# Patient Record
Sex: Female | Born: 1940 | Hispanic: No | Smoking: Never smoker
Health system: Southern US, Community
[De-identification: ages and names within clinical notes are randomized; demographics above are authoritative.]

## PROBLEM LIST (undated history)

## (undated) DIAGNOSIS — F32A Depression, unspecified: Secondary | ICD-10-CM

## (undated) DIAGNOSIS — F0390 Unspecified dementia without behavioral disturbance: Secondary | ICD-10-CM

## (undated) DIAGNOSIS — F419 Anxiety disorder, unspecified: Secondary | ICD-10-CM

## (undated) DIAGNOSIS — I10 Essential (primary) hypertension: Secondary | ICD-10-CM

## (undated) DIAGNOSIS — G20A1 Parkinson's disease without dyskinesia, without mention of fluctuations: Secondary | ICD-10-CM

## (undated) HISTORY — PX: CARDIAC SURGERY: SHX584

---

## 2012-05-09 DIAGNOSIS — I10 Essential (primary) hypertension: Secondary | ICD-10-CM

## 2012-06-08 ENCOUNTER — Ambulatory Visit: Payer: Self-pay | Admitting: Ophthalmology

## 2012-06-23 ENCOUNTER — Ambulatory Visit: Payer: Self-pay | Admitting: Ophthalmology

## 2012-07-22 ENCOUNTER — Ambulatory Visit: Payer: Self-pay | Admitting: Ophthalmology

## 2012-07-28 ENCOUNTER — Ambulatory Visit: Payer: Self-pay | Admitting: Ophthalmology

## 2015-03-07 NOTE — Op Note (Signed)
PATIENT NAME:  Jacqueline Stein, Jacqueline Stein MR#:  [id number] DATE OF BIRTH:  [birth year]  DATE OF PROCEDURE:  07/28/2012  PREOPERATIVE DIAGNOSIS: Visually significant cataract of the left eye.   POSTOPERATIVE DIAGNOSIS: Visually significant cataract of the left eye.   OPERATIVE PROCEDURE: Cataract extraction by phacoemulsification with implant of intraocular lens to left eye.   SURGEON: Galen ManilaWilliam Rielly Brunn, MD.   ANESTHESIA:  1. Managed anesthesia care.  2. Topical tetracaine drops followed by 2% Xylocaine jelly applied in the preoperative holding area.   COMPLICATIONS: None.   TECHNIQUE:  Stop and chop.   DESCRIPTION OF PROCEDURE: The patient was examined and consented in the preoperative holding area where the aforementioned topical anesthesia was applied to the left eye and then brought back to the Operating Room where the left eye was prepped and draped in the usual sterile ophthalmic fashion and a lid speculum was placed. A paracentesis was created with the side port blade and the anterior chamber was filled with viscoelastic. A near clear corneal incision was performed with the steel keratome. A continuous curvilinear capsulorrhexis was performed with a cystotome followed by the capsulorrhexis forceps. Hydrodissection and hydrodelineation were carried out with BSS on a blunt cannula. The lens was removed in a stop and chop technique and the remaining cortical material was removed with the irrigation-aspiration handpiece. The capsular bag was inflated with viscoelastic and the  Technis ZCB00 21.5-diopter lens, serial number [id number] was placed in the capsular bag without complication. The remaining viscoelastic was removed from the eye with the irrigation-aspiration handpiece. The wounds were hydrated. The anterior chamber was flushed with Miostat and the eye was inflated to physiologic pressure. The wounds were found to be water tight. The eye was dressed with Vigamox. The patient was given protective glasses  to wear throughout the day and a shield with which to sleep tonight. The patient was also given drops with which to begin a drop regimen today and will follow-up with me in one day.   ____________________________ Jacqueline FieldWilliam L. Khalis Hittle, MD wlp:cms D: 07/28/2012 17:18:27 ET T: 07/28/2012 17:39:48 ET JOB#: [id number]  cc: Vrishank Moster L. Ardath Lepak, MD, <Dictator> Jacqueline FieldWILLIAM L Keiron Iodice MD ELECTRONICALLY SIGNED 07/30/2012 13:13

## 2015-03-07 NOTE — Op Note (Signed)
PATIENT NAME:  Jacqueline Stein, Jacqueline Stein MR#:  [id number] DATE OF BIRTH:  01-01-1941  DATE OF PROCEDURE:  06/23/2012  PREOPERATIVE DIAGNOSIS: Visually significant cataract of the right eye.   POSTOPERATIVE DIAGNOSIS: Visually significant cataract of the right eye.   OPERATIVE PROCEDURE: Cataract extraction by phacoemulsification with implant of intraocular lens to right eye.   SURGEON: Galen ManilaWilliam Willard Farquharson, MD.   ANESTHESIA:  1. Managed anesthesia care.  2. Topical tetracaine drops followed by 2% Xylocaine jelly applied in the preoperative holding area.   COMPLICATIONS: None.   TECHNIQUE:  Stop and chop.  DESCRIPTION OF PROCEDURE: The patient was examined and consented in the preoperative holding area where the aforementioned topical anesthesia was applied to the right eye and then brought back to the Operating Room where the right eye was prepped and draped in the usual sterile ophthalmic fashion and a lid speculum was placed. A paracentesis was created with the side port blade and the anterior chamber was filled with viscoelastic. A near clear corneal incision was performed with the steel keratome. A continuous curvilinear capsulorrhexis was performed with a cystotome followed by the capsulorrhexis forceps. Hydrodissection and hydrodelineation were carried out with BSS on a blunt cannula. The lens was removed in a stop and chop technique and the remaining cortical material was removed with the irrigation-aspiration handpiece. The capsular bag was inflated with viscoelastic and the Technis ZCBOO  21.5-diopter lens, serial number [id number], was placed in the capsular bag without complication. The remaining viscoelastic was removed from the eye with the irrigation-aspiration handpiece. The wounds were hydrated. The anterior chamber was flushed with Miostat and the eye was inflated to physiologic pressure. The wounds were found to be water tight. The eye was dressed with Vigamox. The patient was given protective  glasses to wear throughout the day and a shield with which to sleep tonight. The patient was also given drops with which to begin a drop regimen today and will follow-up with me in one day.   ____________________________ Jacqueline FieldWilliam L. Haylin Camilli, MD wlp:cbb D: 06/23/2012 12:34:56 ET T: 06/23/2012 13:21:46 ET JOB#: [id number]  cc: Sakiya Stepka L. Jessa Stinson, MD, <Dictator> Jacqueline FieldWILLIAM L Swati Granberry MD ELECTRONICALLY SIGNED 06/24/2012 16:28

## 2015-06-26 ENCOUNTER — Other Ambulatory Visit: Payer: Self-pay | Admitting: Internal Medicine

## 2015-06-26 DIAGNOSIS — Z1231 Encounter for screening mammogram for malignant neoplasm of breast: Secondary | ICD-10-CM

## 2015-06-29 ENCOUNTER — Ambulatory Visit
Admission: RE | Admit: 2015-06-29 | Discharge: 2015-06-29 | Disposition: A | Discharge status: Home / Self Care | Payer: Medicare Other | Source: Ambulatory Visit | Attending: Internal Medicine | Admitting: Internal Medicine

## 2015-06-29 DIAGNOSIS — Z1231 Encounter for screening mammogram for malignant neoplasm of breast: Secondary | ICD-10-CM

## 2016-05-24 ENCOUNTER — Other Ambulatory Visit: Payer: Self-pay | Admitting: Family Medicine

## 2016-05-24 DIAGNOSIS — Z1231 Encounter for screening mammogram for malignant neoplasm of breast: Secondary | ICD-10-CM

## 2016-07-01 ENCOUNTER — Other Ambulatory Visit: Payer: Self-pay | Admitting: Family Medicine

## 2016-07-01 ENCOUNTER — Ambulatory Visit
Admission: RE | Admit: 2016-07-01 | Discharge: 2016-07-01 | Disposition: A | Discharge status: Home / Self Care | Payer: Medicare Other | Source: Ambulatory Visit | Attending: Family Medicine | Admitting: Family Medicine

## 2016-07-01 DIAGNOSIS — Z1231 Encounter for screening mammogram for malignant neoplasm of breast: Secondary | ICD-10-CM

## 2017-05-23 ENCOUNTER — Other Ambulatory Visit: Payer: Self-pay | Admitting: Family Medicine

## 2017-05-23 DIAGNOSIS — Z1231 Encounter for screening mammogram for malignant neoplasm of breast: Secondary | ICD-10-CM

## 2017-08-05 ENCOUNTER — Ambulatory Visit
Admission: RE | Admit: 2017-08-05 | Discharge: 2017-08-05 | Disposition: A | Discharge status: Home / Self Care | Payer: Medicare Other | Source: Ambulatory Visit | Attending: Family Medicine | Admitting: Family Medicine

## 2017-08-05 DIAGNOSIS — Z1231 Encounter for screening mammogram for malignant neoplasm of breast: Secondary | ICD-10-CM | POA: Insufficient documentation

## 2017-09-09 ENCOUNTER — Encounter: Payer: Self-pay | Admitting: Emergency Medicine

## 2017-09-09 ENCOUNTER — Ambulatory Visit
Admission: EM | Admit: 2017-09-09 | Discharge: 2017-09-09 | Disposition: A | Discharge status: Home / Self Care | Payer: Medicare Other | Attending: Family Medicine | Admitting: Family Medicine

## 2017-09-09 DIAGNOSIS — J988 Other specified respiratory disorders: Secondary | ICD-10-CM

## 2017-09-09 HISTORY — DX: Essential (primary) hypertension: I10

## 2017-09-09 MED ORDER — HYDROCOD POLST-CPM POLST ER 10-8 MG/5ML PO SUER: 5.0000 mL | Freq: Two times a day (BID) | ORAL | 0 refills | Status: AC | PRN

## 2017-09-09 NOTE — ED Provider Notes (Signed)
[facility]    CSN: [id number] Arrival date & time: 09/09/17  0830  History   Chief Complaint Chief Complaint  Patient presents with  . Cough  . Sore Throat   HPI  76 year old female presents with the above complaints.  Patient states that she has been sick since this past Wednesday.  Symptoms started with sore throat and then progressed to cough and congestion with associated body aches.  No documented fever.  No known exacerbating relieving factors.  No reports of shortness of breath.  No chills.  Cough is productive at times.  Cough is worse at night and interferes with sleep.  Patient states that her symptoms are severe.  She is feeling poorly.  No other associated symptoms.  No other complaints or concerns at this time.  PMH - CAD s/p CABG, HLD, HTN, Osteopenia, GERD  Surgical History - CABG, Angioplasty, Knee Arthroscopy, Colonoscopy.  Home Medications    Prior to Admission medications   Medication Sig Start Date End Date Taking? Authorizing Provider  aspirin 81 MG chewable tablet Chew 81 mg by mouth daily.   Yes [provider]  atenolol (TENORMIN) 50 MG tablet Take 50 mg by mouth daily.   Yes [provider]  clopidogrel (PLAVIX) 75 MG tablet Take 75 mg by mouth daily.   Yes [provider]  hydrochlorothiazide (MICROZIDE) 12.5 MG capsule Take 12.5 mg by mouth daily.   Yes [provider]  losartan (COZAAR) 100 MG tablet Take 100 mg by mouth daily.   Yes [provider]  Multiple Vitamin (MULTIVITAMIN) tablet Take 1 tablet by mouth daily.   Yes [provider]  chlorpheniramine-HYDROcodone (TUSSIONEX PENNKINETIC ER) 10-8 MG/5ML SUER Take 5 mLs by mouth every 12 (twelve) hours as needed. 09/09/17   Tommie Sams, DO    Family History Family History  Problem Relation Age of Onset  . Breast cancer Neg Hx    Social History Social History  Substance Use Topics  . Smoking status: Never Smoker  .  Smokeless tobacco: Never Used  . Alcohol use No   Allergies   Pepcid [famotidine] and Statins  Review of Systems Review of Systems  Constitutional: Negative for fever.  HENT: Positive for congestion and sore throat.   Respiratory: Positive for cough. Negative for shortness of breath.   All other systems reviewed and are negative.  Physical Exam Triage Vital Signs ED Triage Vitals  Enc Vitals Group     BP 09/09/17 0853 (!) 147/63     Pulse Rate 09/09/17 0853 74     Resp 09/09/17 0853 14     Temp 09/09/17 0853 98.3 F (36.8 C)     Temp Source 09/09/17 0853 Oral     SpO2 09/09/17 0853 100 %     Weight 09/09/17 0847 123 lb 12.8 oz (56.2 kg)     Height 09/09/17 0847 5\' 3"  (1.6 m)     Head Circumference --      Peak Flow --      Pain Score 09/09/17 0847 3     Pain Loc --      Pain Edu? --      Excl. in GC? --    Updated Vital Signs BP (!) 147/63 (BP Location: Left Arm)   Pulse 74   Temp 98.3 F (36.8 C) (Oral)   Resp 14   Ht 5\' 3"  (1.6 m)   Wt 123 lb 12.8 oz (56.2 kg)   SpO2 100%   BMI  21.93 kg/m   Physical Exam  Constitutional: She is oriented to person, place, and time. She appears well-developed. No distress.  HENT:  Head: Normocephalic and atraumatic.  Nose: Nose normal.  Mouth/Throat: Oropharynx is clear and moist. No oropharyngeal exudate.  TM's normal bilaterally.   Eyes: Conjunctivae are normal. Right eye exhibits no discharge. Left eye exhibits no discharge.  Neck: Normal range of motion. Neck supple.  Cardiovascular: Normal rate and regular rhythm.   No murmur heard. Pulmonary/Chest: Effort normal and breath sounds normal. No respiratory distress. She has no wheezes. She has no rales.  Abdominal: Soft. She exhibits no distension. There is no tenderness. There is no rebound and no guarding.  Lymphadenopathy:    She has no cervical adenopathy.  Neurological: She is alert and oriented to person, place, and time.  No focal deficits.  Skin: Skin is warm.  No rash noted.  Psychiatric: She has a normal mood and affect. Her behavior is normal.  Vitals reviewed.  UC Treatments / Results  Labs (all labs ordered are listed, but only abnormal results are displayed) Labs Reviewed - No data to display  EKG  EKG Interpretation None       Radiology No results found.  Procedures Procedures (including critical care time)  Medications Ordered in UC Medications - No data to display   Initial Impression / Assessment and Plan / UC Course  I have reviewed the triage vital signs and the nursing notes.  Pertinent labs & imaging results that were available during my care of the patient were reviewed by me and considered in my medical decision making (see chart for details).    76 year old female presents with a viral respiratory illness.  Advised supportive care and over-the-counter Tylenol as well as rest.  Tussionex for cough.  Final Clinical Impressions(s) / UC Diagnoses   Final diagnoses:  Respiratory infection   New Prescriptions Discharge Medication List as of 09/09/2017  9:06 AM    START taking these medications   Details  chlorpheniramine-HYDROcodone (TUSSIONEX PENNKINETIC ER) 10-8 MG/5ML SUER Take 5 mLs by mouth every 12 (twelve) hours as needed., Starting Tue 09/09/2017, Print       Controlled Substance Prescriptions [place] Controlled Substance Registry consulted? Not Applicable   Tommie SamsCook, Ringo Sherod G, DO 09/09/17 [id number]

## 2017-09-09 NOTE — ED Triage Notes (Signed)
Patient c/o cough, congestion, sore throat and runny nose that started last Wed.  Patient denies fevers.

## 2017-09-09 NOTE — Discharge Instructions (Signed)
This is viral.  Rest. Tylenol as needed.  Use the cough medication as directed.  Feel better.  Take care  Dr. Adriana Simasook

## 2018-06-29 ENCOUNTER — Other Ambulatory Visit: Payer: Self-pay | Admitting: Family Medicine

## 2018-06-29 DIAGNOSIS — Z1231 Encounter for screening mammogram for malignant neoplasm of breast: Secondary | ICD-10-CM

## 2018-08-10 ENCOUNTER — Ambulatory Visit
Admission: RE | Admit: 2018-08-10 | Discharge: 2018-08-10 | Disposition: A | Discharge status: Home / Self Care | Payer: Medicare HMO | Source: Ambulatory Visit | Attending: Family Medicine | Admitting: Family Medicine

## 2018-08-10 DIAGNOSIS — Z1231 Encounter for screening mammogram for malignant neoplasm of breast: Secondary | ICD-10-CM

## 2019-07-19 IMAGING — MG MM DIGITAL SCREENING BILAT W/ TOMO W/ CAD
8 of 12 series · 8 of 28 positions shown · non-contrast
Comparison: Previous exam(s).

CLINICAL DATA: Screening.

EXAM:
2D DIGITAL SCREENING BILATERAL MAMMOGRAM WITH CAD AND ADJUNCT TOMO

[R CC]
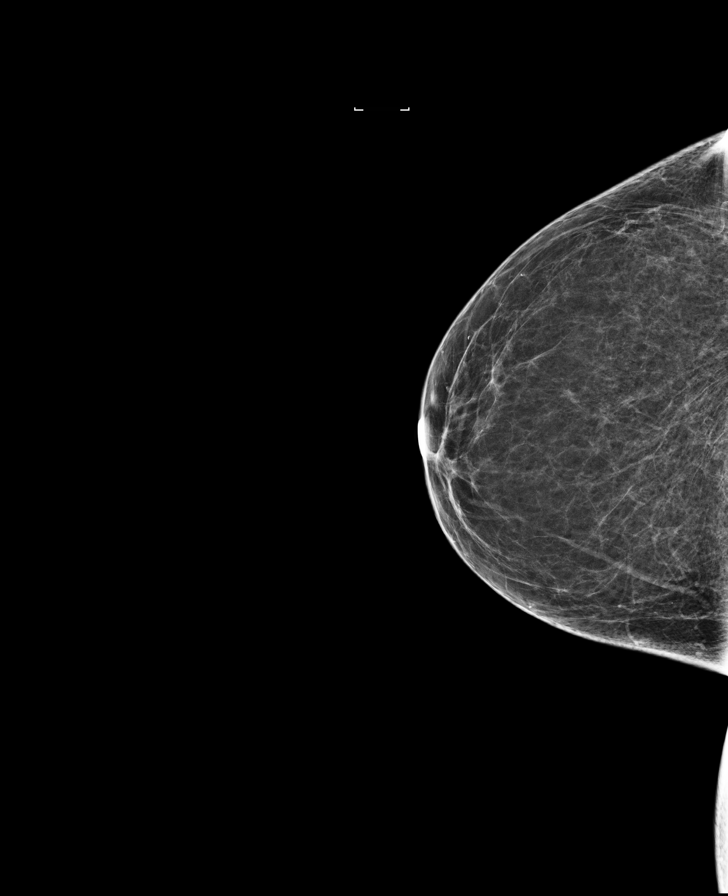

[L CC]
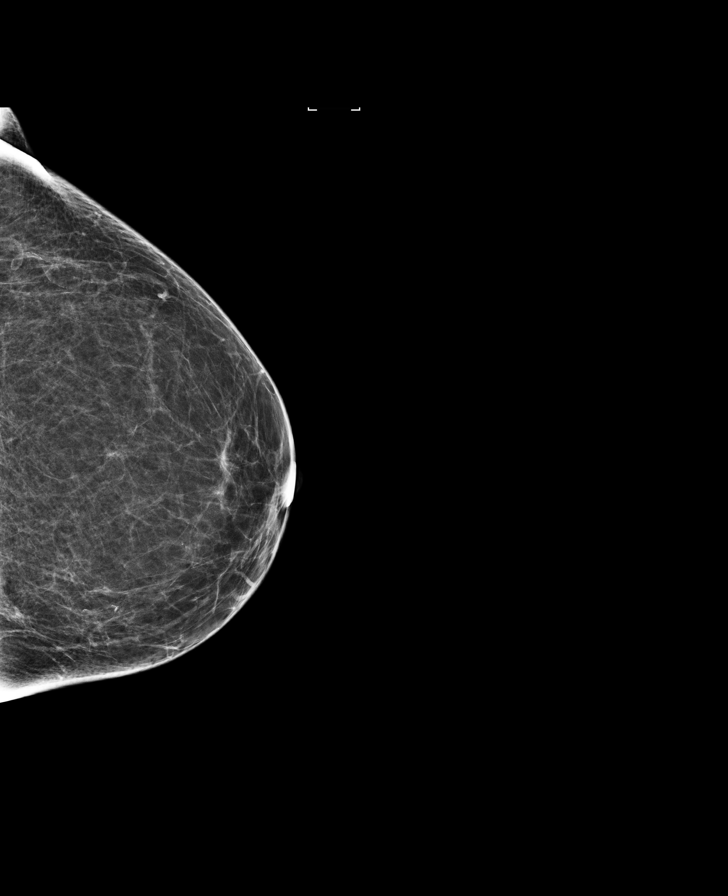

[L MLO]
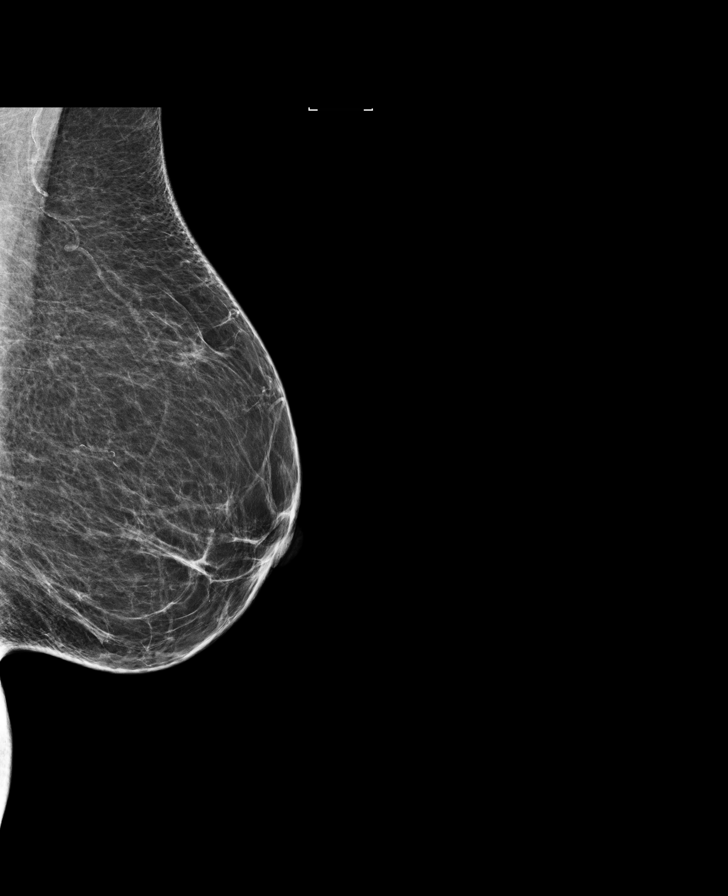

[L CC synth-2D]
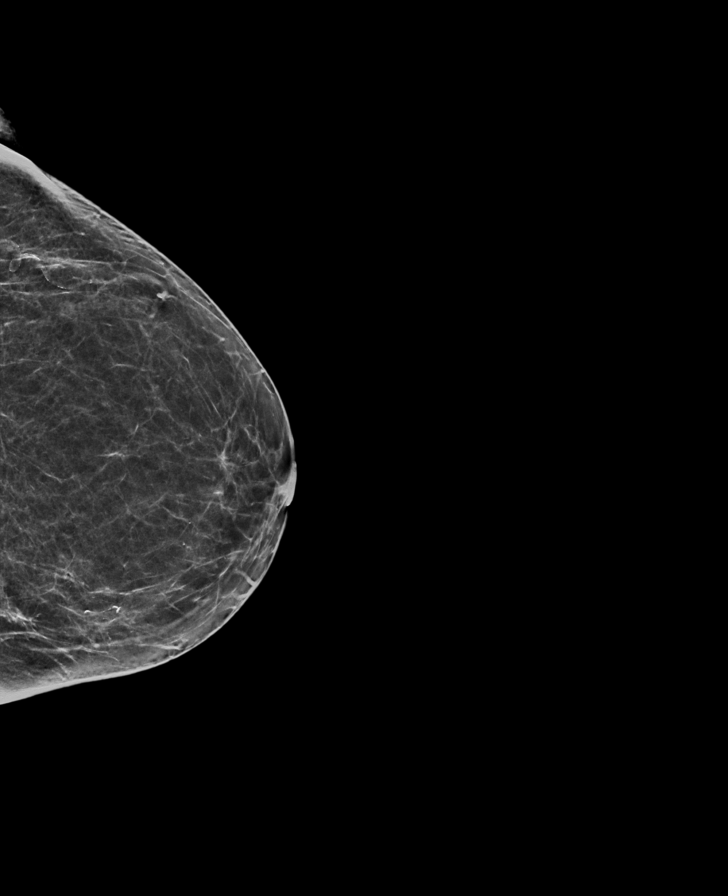

[L MLO synth-2D]
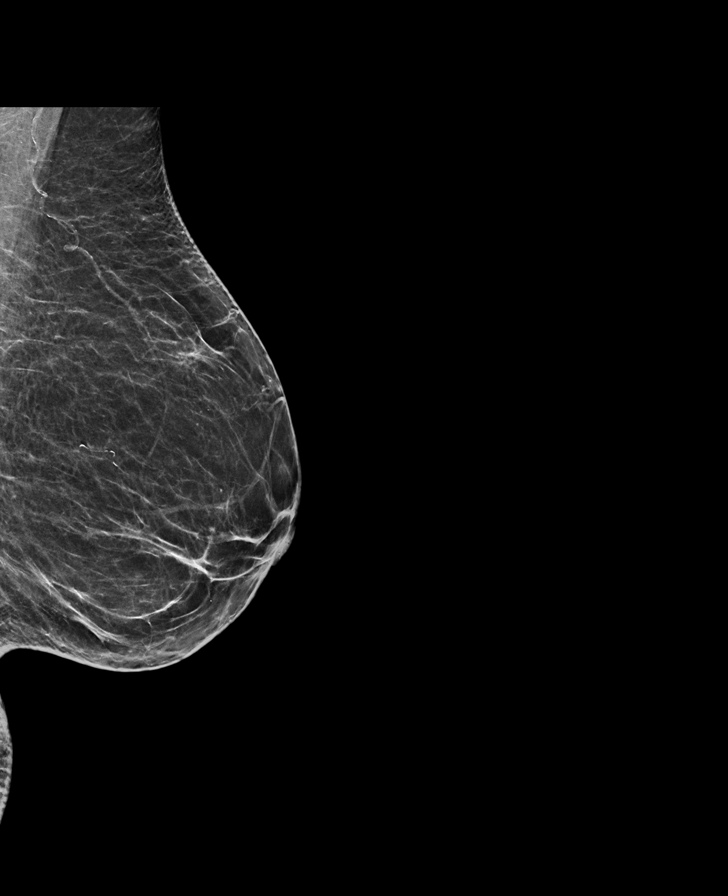

[R MLO synth-2D]
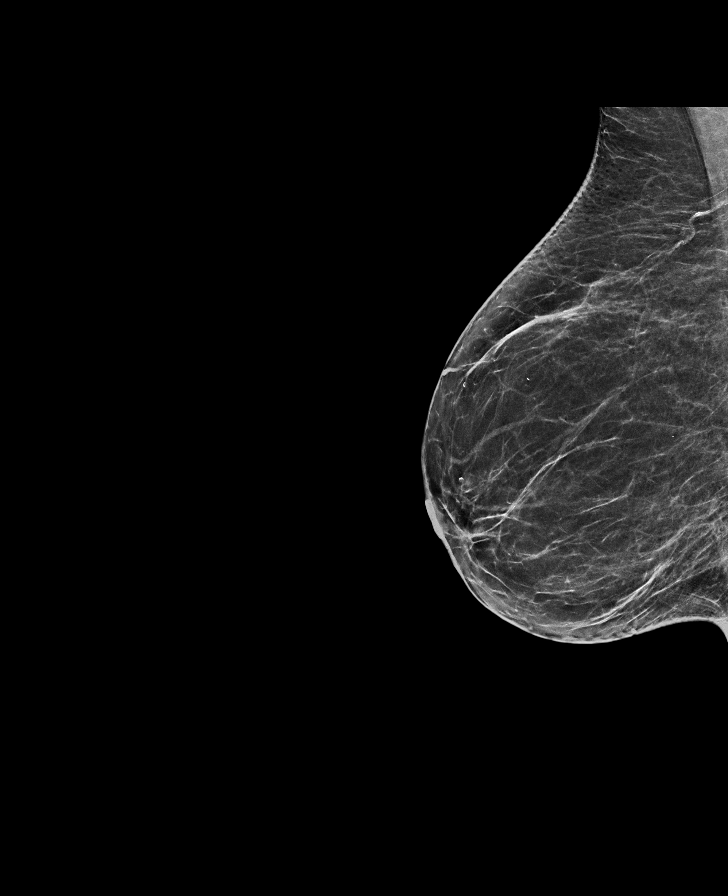

[R CC synth-2D]
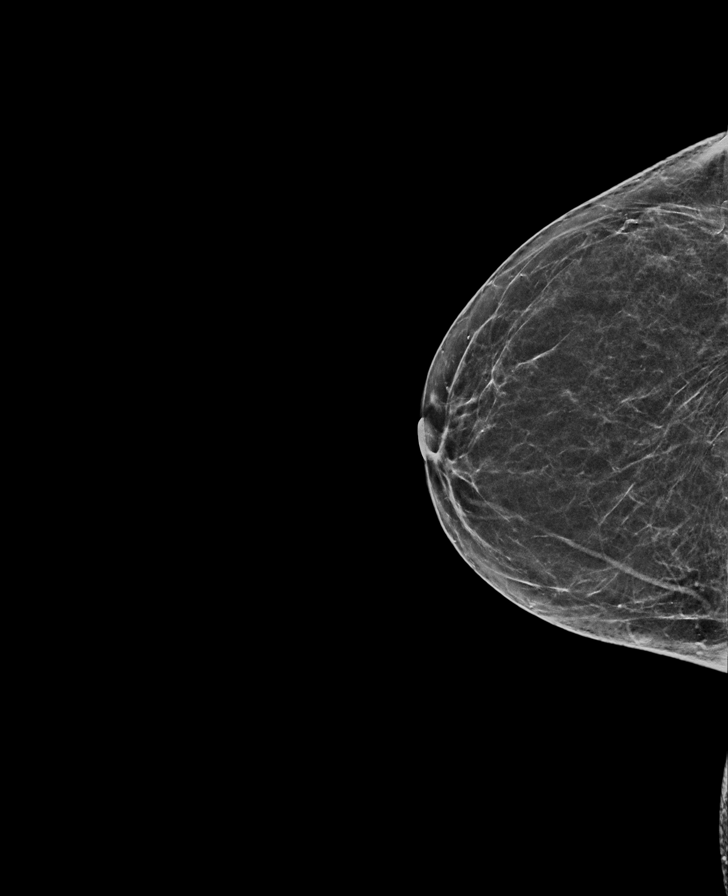

[R MLO]
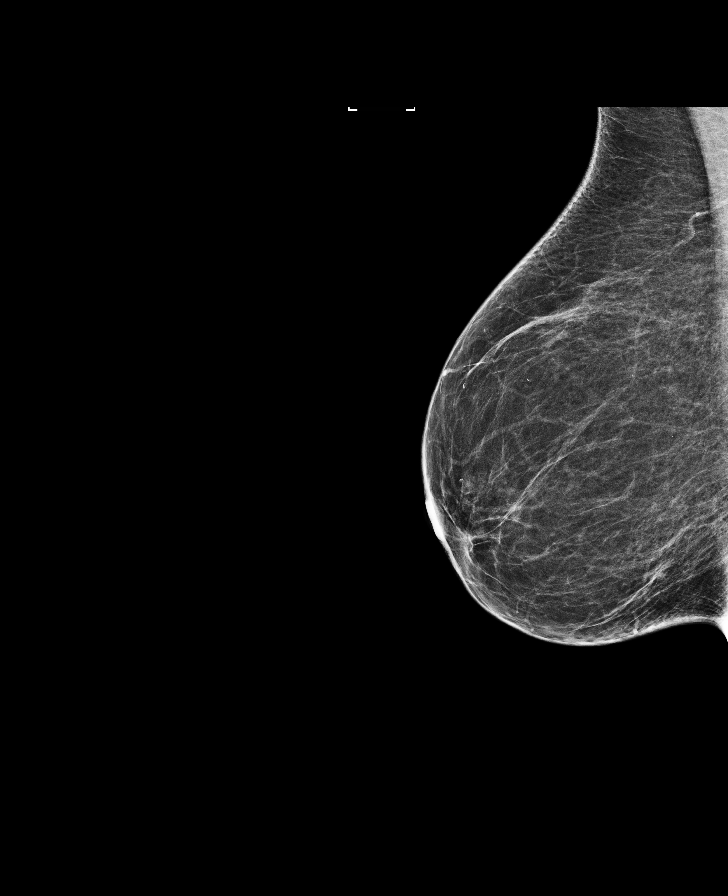

[8 of 28 positions shown; findings below may reference images not displayed]

ACR Breast Density Category b: There are scattered areas of
fibroglandular density.
FINDINGS: There are no findings suspicious for malignancy. Images were
processed with CAD.
IMPRESSION: No mammographic evidence of malignancy. A result letter of this
screening mammogram will be mailed directly to the patient.

RECOMMENDATION:
Screening mammogram in one year. (Code:97-6-RS4)

BI-RADS CATEGORY  1: Negative.

## 2019-08-13 ENCOUNTER — Other Ambulatory Visit: Payer: Self-pay | Admitting: Family Medicine

## 2019-08-13 DIAGNOSIS — Z1231 Encounter for screening mammogram for malignant neoplasm of breast: Secondary | ICD-10-CM

## 2019-09-08 ENCOUNTER — Ambulatory Visit
Admission: RE | Admit: 2019-09-08 | Discharge: 2019-09-08 | Disposition: A | Discharge status: Home / Self Care | Payer: Medicare HMO | Source: Ambulatory Visit | Attending: Family Medicine | Admitting: Family Medicine

## 2019-09-08 ENCOUNTER — Other Ambulatory Visit: Payer: Self-pay

## 2019-09-08 ENCOUNTER — Encounter (INDEPENDENT_AMBULATORY_CARE_PROVIDER_SITE_OTHER): Payer: Self-pay

## 2019-09-08 DIAGNOSIS — Z1231 Encounter for screening mammogram for malignant neoplasm of breast: Secondary | ICD-10-CM | POA: Diagnosis not present

## 2020-08-10 ENCOUNTER — Other Ambulatory Visit: Payer: Self-pay

## 2024-06-07 ENCOUNTER — Encounter: Payer: Self-pay | Admitting: Emergency Medicine

## 2024-06-07 ENCOUNTER — Emergency Department

## 2024-06-07 ENCOUNTER — Other Ambulatory Visit: Payer: Self-pay

## 2024-06-07 ENCOUNTER — Emergency Department
Admission: EM | Admit: 2024-06-07 | Discharge: 2024-06-08 | Disposition: A | Discharge status: Home / Self Care | Attending: Emergency Medicine | Admitting: Emergency Medicine

## 2024-06-07 DIAGNOSIS — G20A1 Parkinson's disease without dyskinesia, without mention of fluctuations: Secondary | ICD-10-CM | POA: Diagnosis not present

## 2024-06-07 DIAGNOSIS — W010XXA Fall on same level from slipping, tripping and stumbling without subsequent striking against object, initial encounter: Secondary | ICD-10-CM | POA: Diagnosis not present

## 2024-06-07 DIAGNOSIS — S52502A Unspecified fracture of the lower end of left radius, initial encounter for closed fracture: Secondary | ICD-10-CM | POA: Insufficient documentation

## 2024-06-07 DIAGNOSIS — W19XXXA Unspecified fall, initial encounter: Secondary | ICD-10-CM

## 2024-06-07 DIAGNOSIS — F0390 Unspecified dementia without behavioral disturbance: Secondary | ICD-10-CM | POA: Diagnosis not present

## 2024-06-07 DIAGNOSIS — S6992XA Unspecified injury of left wrist, hand and finger(s), initial encounter: Secondary | ICD-10-CM | POA: Diagnosis present

## 2024-06-07 DIAGNOSIS — S52501A Unspecified fracture of the lower end of right radius, initial encounter for closed fracture: Secondary | ICD-10-CM

## 2024-06-07 DIAGNOSIS — I10 Essential (primary) hypertension: Secondary | ICD-10-CM | POA: Insufficient documentation

## 2024-06-07 HISTORY — DX: Depression, unspecified: F32.A

## 2024-06-07 HISTORY — DX: Anxiety disorder, unspecified: F41.9

## 2024-06-07 HISTORY — DX: Unspecified dementia, unspecified severity, without behavioral disturbance, psychotic disturbance, mood disturbance, and anxiety: F03.90

## 2024-06-07 HISTORY — DX: Parkinson's disease without dyskinesia, without mention of fluctuations: G20.A1

## 2024-06-07 MED ORDER — LIDOCAINE HCL (PF) 1 % IJ SOLN
5.0000 mL | Freq: Once | INTRAMUSCULAR | Status: AC
  Administered 2024-06-07: 5 mL
  Filled 2024-06-07: qty 5

## 2024-06-07 MED ORDER — OXYCODONE HCL 5 MG PO TABS: 5.0000 mg | ORAL_TABLET | Freq: Once | ORAL | Status: DC

## 2024-06-07 MED ORDER — MORPHINE SULFATE (PF) 2 MG/ML IV SOLN
2.0000 mg | Freq: Once | INTRAVENOUS | Status: DC
  Filled 2024-06-07: qty 1

## 2024-06-07 MED ORDER — FENTANYL CITRATE PF 50 MCG/ML IJ SOSY
50.0000 ug | PREFILLED_SYRINGE | Freq: Once | INTRAMUSCULAR | Status: AC
  Administered 2024-06-07: 50 ug via INTRAMUSCULAR
  Filled 2024-06-07: qty 1

## 2024-06-07 NOTE — ED Provider Notes (Signed)
 [facility] Emergency Department Provider Note     Event Date/Time   First MD Initiated Contact with Patient 06/07/24 2210     (approximate)   History   Wrist Pain   HPI  Jacqueline Stein is a 83 y.o. female with a history of HTN, dementia and Parkinson disease presents to the ED following a fall onto her left wrist.  Patients daughter, who is main historian, reports the patient took a shower on her own today and slipped and fell. Denies head injury or LOC. Deformity noted with moderate swelling to left wrist.      Physical Exam   Triage Vital Signs: ED Triage Vitals  Encounter Vitals Group     BP 06/07/24 2144 136/62     Girls Systolic BP Percentile --      Girls Diastolic BP Percentile --      Boys Systolic BP Percentile --      Boys Diastolic BP Percentile --      Pulse Rate 06/07/24 2144 77     Resp 06/07/24 2144 18     Temp 06/07/24 2144 98.3 F (36.8 C)     Temp Source 06/07/24 2144 Oral     SpO2 06/07/24 2144 97 %     Weight 06/07/24 2143 140 lb (63.5 kg)     Height 06/07/24 2143 5' 3 (1.6 m)     Head Circumference --      Peak Flow --      Pain Score 06/07/24 2142 8     Pain Loc --      Pain Education --      Exclude from Growth Chart --     Most recent vital signs: Vitals:   06/07/24 2318 06/08/24 0047  BP: 134/68 (!) 154/68  Pulse: 70 71  Resp: 18 18  Temp:    SpO2: 97% 98%    General Awake, no distress.  CV:  Good peripheral perfusion.  RESP:  Normal effort.  ABD:  No distention.  Other:  Left wrist reveals obvious deformity. Radial pulse palpated. Limited FROM of all digits secondary to pain however her motor function remains intact. Brisk capillary refill.    ED Results / Procedures / Treatments   Labs (all labs ordered are listed, but only abnormal results are displayed) Labs Reviewed - No data to display  RADIOLOGY  I personally viewed and evaluated these images as part of my medical decision making,  as well as reviewing the written report by the radiologist.  ED Provider Interpretation: Left distal radius fracture   DG Wrist 2 Views Left Result Date: 06/08/2024 EXAM: 2 VIEW(S) XRAY OF THE LEFT WRIST 06/08/2024 12:25:56 AM COMPARISON: 06/07/2024 CLINICAL HISTORY: [id number] Injury [id number]. POST REDUCTION LEFT WRIST FINDINGS: BONES AND JOINTS: Mildly displaced, impacted distal radial fracture with apex volar angulation. Overlying cast obscures fine osseous detail. SOFT TISSUES: The soft tissues are unremarkable. IMPRESSION: 1. Mildly displaced, impacted distal radial fracture, as above. Electronically signed by: Pinkie Pebbles MD 06/08/2024 12:27 AM EDT RP Workstation: [id number]   DG Wrist Complete Left Result Date: 06/07/2024 CLINICAL DATA:  Status post fall. EXAM: LEFT WRIST - COMPLETE 3+ VIEW COMPARISON:  None Available. FINDINGS: An acute fracture deformity is seen involving the distal left radius. Mild dorsal angulation of the distal fracture site is seen. There is no evidence of dislocation. Degenerative changes are present along the first carpometacarpal joint. Mild soft tissue swelling is seen along the previously noted fracture site. IMPRESSION:  Acute fracture of the distal left radius. Electronically Signed   By: Suzen Dials M.D.   On: 06/07/2024 22:29    PROCEDURES:  Critical Care performed: No  .Reduction of fracture  Date/Time: 06/08/2024 2:38 AM  Performed by: Margrette Monte A, PA-C Authorized by: Margrette Monte LABOR, PA-C  Consent: Verbal consent obtained Patient identity confirmed: arm band Local anesthesia used: yes Anesthesia: hematoma block  Anesthesia: Local anesthesia used: yes Local Anesthetic: lidocaine  1% without epinephrine  Sedation: Patient sedated: no  Patient tolerance: patient tolerated the procedure well with no immediate complications    MEDICATIONS ORDERED IN ED: Medications  lidocaine  (PF) (XYLOCAINE ) 1 % injection 5 mL (5 mLs Infiltration  Given 06/07/24 2314)  fentaNYL  (SUBLIMAZE ) injection 50 mcg (50 mcg Intramuscular Given 06/07/24 2314)  oxyCODONE -acetaminophen  (PERCOCET/ROXICET) 5-325 MG per tablet 1 tablet (1 tablet Oral Given 06/08/24 0042)     IMPRESSION / MDM / ASSESSMENT AND PLAN / ED COURSE  I reviewed the triage vital signs and the nursing notes.                               83 y.o. female presents to the emergency department for evaluation and treatment of acute distal radius. See HPI for further details.   Differential diagnosis includes, but is not limited to fracture, dislocation, sprain   Patient's presentation is most consistent with acute complicated illness / injury requiring diagnostic workup.  Initial xrays showed mild dorsal angulation. Physical exam, neurovascular remain intact. Reviewed images with supervising attending, Bradler,MD who agreed wrist reduction needed. Hematoma block performed by myself, please see procedure note, followed by attempted closed reduction. Post-reduction films showed volar angulation with impaction. Ortho consulted and secure message sent to Poggi, MD reviewed images and alignment acceptable for discharge with outpatient follow-up sometime this week and surgery is likely needed. Discharged with Percocet for pain control, noted possible codeine allergy, patient advised accordingly. Sling provided for support with arm in posterior short arm plus sugar tong. Will call and follow up with Ortho in 1 day.   FINAL CLINICAL IMPRESSION(S) / ED DIAGNOSES   Final diagnoses:  Closed fracture of distal end of right radius, unspecified fracture morphology, initial encounter  Fall, initial encounter   Rx / DC Orders   ED Discharge Orders          Ordered    oxyCODONE -acetaminophen  (PERCOCET) 7.5-325 MG tablet  Every 4 hours PRN        06/08/24 0042             Note:  This document was prepared using Dragon voice recognition software and may include unintentional dictation  errors.    Margrette, Hartley Wyke A, PA-C 06/08/24 0240    Jossie Artist POUR, MD 06/08/24 [phone]

## 2024-06-07 NOTE — ED Triage Notes (Signed)
 Pt presents to the ED via POV with complaints of L wrist pain following a fall tonight. Pt was showering and slipped getting out landing on her L wrist - Visible swelling noted. No other injuries note. Not on thinners. A&Ox4 at this time. Denies CP or SOB.

## 2024-06-08 ENCOUNTER — Emergency Department

## 2024-06-08 DIAGNOSIS — S52502A Unspecified fracture of the lower end of left radius, initial encounter for closed fracture: Secondary | ICD-10-CM | POA: Diagnosis not present

## 2024-06-08 MED ORDER — OXYCODONE-ACETAMINOPHEN 5-325 MG PO TABS
1.0000 | ORAL_TABLET | Freq: Once | ORAL | Status: AC
  Administered 2024-06-08: 1 via ORAL
  Filled 2024-06-08: qty 1

## 2024-06-08 MED ORDER — OXYCODONE-ACETAMINOPHEN 7.5-325 MG PO TABS
1.0000 | ORAL_TABLET | ORAL | 0 refills | Status: AC | PRN
Start: 2024-06-08 — End: 2025-06-08

## 2024-06-08 NOTE — Discharge Instructions (Addendum)
 You were evaluated in the ED for a fall with wrist injury.  Your x-ray reveals a fracture of your distal radius.  You will need to follow-up with Dr. Edie with orthopedics for further evaluation and management.  Call his office tomorrow to set up an appointment.

## 2024-07-14 ENCOUNTER — Other Ambulatory Visit: Payer: Self-pay | Admitting: Orthopedic Surgery

## 2024-07-14 DIAGNOSIS — M8448XG Pathological fracture, other site, subsequent encounter for fracture with delayed healing: Secondary | ICD-10-CM

## 2024-07-18 ENCOUNTER — Ambulatory Visit
Admission: RE | Admit: 2024-07-18 | Discharge: 2024-07-18 | Disposition: A | Discharge status: Home / Self Care | Source: Ambulatory Visit | Attending: Orthopedic Surgery | Admitting: Orthopedic Surgery

## 2024-07-18 DIAGNOSIS — M8448XG Pathological fracture, other site, subsequent encounter for fracture with delayed healing: Secondary | ICD-10-CM | POA: Diagnosis present
